# Patient Record
Sex: Male | Born: 1990 | Race: Black or African American | Hispanic: No | Marital: Single | Smoking: Former smoker
Health system: Southern US, Community
[De-identification: ages and names within clinical notes are randomized; demographics above are authoritative.]

---

## 2016-05-05 ENCOUNTER — Emergency Department (HOSPITAL_COMMUNITY): Payer: BLUE CROSS/BLUE SHIELD

## 2016-05-05 ENCOUNTER — Encounter (HOSPITAL_COMMUNITY): Payer: Self-pay | Admitting: Emergency Medicine

## 2016-05-05 ENCOUNTER — Emergency Department (HOSPITAL_COMMUNITY)
Admission: EM | Admit: 2016-05-05 | Discharge: 2016-05-05 | Disposition: A | Discharge status: Home / Self Care | Payer: BLUE CROSS/BLUE SHIELD | Attending: Emergency Medicine | Admitting: Emergency Medicine

## 2016-05-05 DIAGNOSIS — Y9389 Activity, other specified: Secondary | ICD-10-CM | POA: Diagnosis not present

## 2016-05-05 DIAGNOSIS — Z87891 Personal history of nicotine dependence: Secondary | ICD-10-CM | POA: Diagnosis not present

## 2016-05-05 DIAGNOSIS — R55 Syncope and collapse: Secondary | ICD-10-CM | POA: Insufficient documentation

## 2016-05-05 DIAGNOSIS — S20219A Contusion of unspecified front wall of thorax, initial encounter: Secondary | ICD-10-CM | POA: Diagnosis not present

## 2016-05-05 DIAGNOSIS — Y998 Other external cause status: Secondary | ICD-10-CM | POA: Diagnosis not present

## 2016-05-05 DIAGNOSIS — Y92410 Unspecified street and highway as the place of occurrence of the external cause: Secondary | ICD-10-CM | POA: Diagnosis not present

## 2016-05-05 DIAGNOSIS — S30811A Abrasion of abdominal wall, initial encounter: Secondary | ICD-10-CM | POA: Insufficient documentation

## 2016-05-05 DIAGNOSIS — S0081XA Abrasion of other part of head, initial encounter: Secondary | ICD-10-CM | POA: Diagnosis not present

## 2016-05-05 DIAGNOSIS — M899 Disorder of bone, unspecified: Secondary | ICD-10-CM | POA: Diagnosis not present

## 2016-05-05 LAB — COMPREHENSIVE METABOLIC PANEL
ALK PHOS: 43 U/L (ref 38–126)
ALT: 19 U/L (ref 17–63)
AST: 24 U/L (ref 15–41)
Albumin: 3.8 g/dL (ref 3.5–5.0)
Anion gap: 12 (ref 5–15)
BUN: 7 mg/dL (ref 6–20)
CALCIUM: 8.9 mg/dL (ref 8.9–10.3)
CHLORIDE: 107 mmol/L (ref 101–111)
CO2: 25 mmol/L (ref 22–32)
CREATININE: 0.96 mg/dL (ref 0.61–1.24)
GFR calc Af Amer: >60 mL/min (ref >60)
Glucose, Bld: 100 mg/dL — ABNORMAL HIGH (ref 65–99)
Potassium: 3.9 mmol/L (ref 3.5–5.1)
Sodium: 144 mmol/L (ref 135–145)
Total Bilirubin: 0.5 mg/dL (ref 0.3–1.2)
Total Protein: 6.5 g/dL (ref 6.5–8.1)

## 2016-05-05 LAB — CBC WITH DIFFERENTIAL/PLATELET
BASOS PCT: 0 %
Basophils Absolute: 0 10*3/uL (ref 0.0–0.1)
EOS PCT: 2 %
Eosinophils Absolute: 0.1 10*3/uL (ref 0.0–0.7)
HEMATOCRIT: 42.5 % (ref 39.0–52.0)
HEMOGLOBIN: 13.4 g/dL (ref 13.0–17.0)
LYMPHS PCT: 33 %
Lymphs Abs: 1.7 10*3/uL (ref 0.7–4.0)
MCH: 23.1 pg — AB (ref 26.0–34.0)
MCHC: 31.5 g/dL (ref 30.0–36.0)
MCV: 73.4 fL — AB (ref 78.0–100.0)
MONO ABS: 0.4 10*3/uL (ref 0.1–1.0)
MONOS PCT: 7 %
NEUTROS PCT: 58 %
Neutro Abs: 2.9 10*3/uL (ref 1.7–7.7)
PLATELETS: 239 10*3/uL (ref 150–400)
RBC: 5.79 MIL/uL (ref 4.22–5.81)
RDW: 13.9 % (ref 11.5–15.5)
WBC: 5.1 10*3/uL (ref 4.0–10.5)

## 2016-05-05 LAB — I-STAT CG4 LACTIC ACID, ED: LACTIC ACID, VENOUS: 1.38 mmol/L (ref 0.5–2.0)

## 2016-05-05 LAB — ETHANOL: Alcohol, Ethyl (B): 206 mg/dL — ABNORMAL HIGH (ref <5)

## 2016-05-05 MED ORDER — SODIUM CHLORIDE 0.9 % IV BOLUS (SEPSIS)
1000.0000 mL | Freq: Once | INTRAVENOUS | Status: AC
  Administered 2016-05-05: 1000 mL via INTRAVENOUS

## 2016-05-05 MED ORDER — IOPAMIDOL (ISOVUE-300) INJECTION 61%
INTRAVENOUS | Status: AC
  Administered 2016-05-05: 100 mL
  Filled 2016-05-05: qty 100
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
## 2016-05-05 NOTE — ED Notes (Signed)
Ambulated pt in hallway, tolerated well. 

## 2016-05-05 NOTE — ED Notes (Signed)
In xray department.
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
## 2017-06-10 ENCOUNTER — Encounter (HOSPITAL_COMMUNITY): Payer: Self-pay | Admitting: Emergency Medicine

## 2017-06-10 ENCOUNTER — Emergency Department (HOSPITAL_COMMUNITY): Payer: Self-pay

## 2017-06-10 ENCOUNTER — Emergency Department (HOSPITAL_COMMUNITY)
Admission: EM | Admit: 2017-06-10 | Discharge: 2017-06-10 | Disposition: A | Discharge status: Home / Self Care | Payer: Self-pay | Attending: Emergency Medicine | Admitting: Emergency Medicine

## 2017-06-10 DIAGNOSIS — Y939 Activity, unspecified: Secondary | ICD-10-CM | POA: Insufficient documentation

## 2017-06-10 DIAGNOSIS — Z87891 Personal history of nicotine dependence: Secondary | ICD-10-CM | POA: Insufficient documentation

## 2017-06-10 DIAGNOSIS — S0990XA Unspecified injury of head, initial encounter: Secondary | ICD-10-CM | POA: Insufficient documentation

## 2017-06-10 DIAGNOSIS — Y929 Unspecified place or not applicable: Secondary | ICD-10-CM | POA: Insufficient documentation

## 2017-06-10 DIAGNOSIS — Y999 Unspecified external cause status: Secondary | ICD-10-CM | POA: Insufficient documentation

## 2017-06-10 DIAGNOSIS — S00511A Abrasion of lip, initial encounter: Secondary | ICD-10-CM | POA: Insufficient documentation

## 2017-06-10 DIAGNOSIS — Z23 Encounter for immunization: Secondary | ICD-10-CM | POA: Insufficient documentation

## 2017-06-10 MED ORDER — TETANUS-DIPHTH-ACELL PERTUSSIS 5-2.5-18.5 LF-MCG/0.5 IM SUSP
0.5000 mL | Freq: Once | INTRAMUSCULAR | Status: AC
  Administered 2017-06-10: 0.5 mL via INTRAMUSCULAR
  Filled 2017-06-10: qty 0.5
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
## 2017-06-10 NOTE — ED Notes (Signed)
Pt able to ambulate in the hall with assistance from staff.
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
## 2017-06-10 NOTE — ED Notes (Signed)
Per EDP, pt to remain in department until he is more alert, able to ambulate. Pt resting at this time.
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
